# Patient Record
Sex: Male | Born: 1985 | Race: White | Hispanic: No | Marital: Married | Smoking: Never smoker
Health system: Southern US, Community
[De-identification: ages and names within clinical notes are randomized; demographics above are authoritative.]

---

## 2015-05-25 ENCOUNTER — Emergency Department (HOSPITAL_COMMUNITY): Payer: 59

## 2015-05-25 ENCOUNTER — Observation Stay (HOSPITAL_COMMUNITY): Payer: 59 | Admitting: Certified Registered Nurse Anesthetist

## 2015-05-25 ENCOUNTER — Encounter (HOSPITAL_COMMUNITY): Payer: Self-pay | Admitting: *Deleted

## 2015-05-25 ENCOUNTER — Encounter (HOSPITAL_COMMUNITY)
Admission: EM | Disposition: A | Discharge status: Home / Self Care | Payer: Self-pay | Source: Home / Self Care | Attending: Emergency Medicine

## 2015-05-25 ENCOUNTER — Observation Stay (HOSPITAL_COMMUNITY)
Admission: EM | Admit: 2015-05-25 | Discharge: 2015-05-26 | Disposition: A | Discharge status: Home / Self Care | Payer: 59 | Attending: General Surgery | Admitting: General Surgery

## 2015-05-25 DIAGNOSIS — K3580 Unspecified acute appendicitis: Secondary | ICD-10-CM | POA: Diagnosis present

## 2015-05-25 DIAGNOSIS — K353 Acute appendicitis with localized peritonitis, without perforation or gangrene: Secondary | ICD-10-CM

## 2015-05-25 HISTORY — PX: LAPAROSCOPIC APPENDECTOMY: SHX408

## 2015-05-25 LAB — URINALYSIS, ROUTINE W REFLEX MICROSCOPIC
BILIRUBIN URINE: NEGATIVE
Glucose, UA: NEGATIVE mg/dL
HGB URINE DIPSTICK: NEGATIVE
KETONES UR: NEGATIVE mg/dL
Leukocytes, UA: NEGATIVE
Nitrite: NEGATIVE
Protein, ur: NEGATIVE mg/dL
SPECIFIC GRAVITY, URINE: 1.017 (ref 1.005–1.030)
UROBILINOGEN UA: 0.2 mg/dL (ref 0.0–1.0)
pH: 6.5 (ref 5.0–8.0)

## 2015-05-25 LAB — SURGICAL PCR SCREEN
MRSA, PCR: NEGATIVE
Staphylococcus aureus: NEGATIVE

## 2015-05-25 LAB — CBC
HEMATOCRIT: 45.7 % (ref 39.0–52.0)
Hemoglobin: 15.4 g/dL (ref 13.0–17.0)
MCH: 31.3 pg (ref 26.0–34.0)
MCHC: 33.7 g/dL (ref 30.0–36.0)
MCV: 92.9 fL (ref 78.0–100.0)
PLATELETS: 248 10*3/uL (ref 150–400)
RBC: 4.92 MIL/uL (ref 4.22–5.81)
RDW: 12.1 % (ref 11.5–15.5)
WBC: 7.8 10*3/uL (ref 4.0–10.5)

## 2015-05-25 LAB — COMPREHENSIVE METABOLIC PANEL
ALBUMIN: 4.9 g/dL (ref 3.5–5.0)
ALK PHOS: 87 U/L (ref 38–126)
ALT: 57 U/L (ref 17–63)
AST: 41 U/L (ref 15–41)
Anion gap: 6 (ref 5–15)
BILIRUBIN TOTAL: 0.8 mg/dL (ref 0.3–1.2)
BUN: 10 mg/dL (ref 6–20)
CALCIUM: 9.6 mg/dL (ref 8.9–10.3)
CO2: 29 mmol/L (ref 22–32)
CREATININE: 0.94 mg/dL (ref 0.61–1.24)
Chloride: 103 mmol/L (ref 101–111)
GFR calc Af Amer: >60 mL/min (ref >60)
GLUCOSE: 96 mg/dL (ref 65–99)
POTASSIUM: 4 mmol/L (ref 3.5–5.1)
Sodium: 138 mmol/L (ref 135–145)
TOTAL PROTEIN: 7.8 g/dL (ref 6.5–8.1)

## 2015-05-25 LAB — LIPASE, BLOOD: Lipase: 73 U/L — ABNORMAL HIGH (ref 22–51)

## 2015-05-25 SURGERY — APPENDECTOMY, LAPAROSCOPIC
Anesthesia: General | Site: Abdomen

## 2015-05-25 MED ORDER — OXYCODONE HCL 5 MG PO TABS
5.0000 mg | ORAL_TABLET | ORAL | Status: DC | PRN
  Administered 2015-05-26: 10 mg via ORAL
  Filled 2015-05-25 (×2): qty 2

## 2015-05-25 MED ORDER — DEXAMETHASONE SODIUM PHOSPHATE 10 MG/ML IJ SOLN
INTRAMUSCULAR | Status: DC | PRN
  Administered 2015-05-25: 10 mg via INTRAVENOUS

## 2015-05-25 MED ORDER — HYDROMORPHONE HCL 2 MG/ML IJ SOLN
INTRAMUSCULAR | Status: AC
  Filled 2015-05-25: qty 1

## 2015-05-25 MED ORDER — NEOSTIGMINE METHYLSULFATE 10 MG/10ML IV SOLN
INTRAVENOUS | Status: AC
  Filled 2015-05-25: qty 1

## 2015-05-25 MED ORDER — ROCURONIUM BROMIDE 100 MG/10ML IV SOLN
INTRAVENOUS | Status: AC
  Filled 2015-05-25: qty 1

## 2015-05-25 MED ORDER — OXYCODONE HCL 5 MG PO TABS: 5.0000 mg | ORAL_TABLET | Freq: Once | ORAL | Status: DC | PRN

## 2015-05-25 MED ORDER — LIDOCAINE HCL (CARDIAC) 20 MG/ML IV SOLN
INTRAVENOUS | Status: AC
  Filled 2015-05-25: qty 5

## 2015-05-25 MED ORDER — GLYCOPYRROLATE 0.2 MG/ML IJ SOLN
INTRAMUSCULAR | Status: DC | PRN
  Administered 2015-05-25: 0.6 mg via INTRAVENOUS

## 2015-05-25 MED ORDER — FENTANYL CITRATE (PF) 100 MCG/2ML IJ SOLN
INTRAMUSCULAR | Status: DC | PRN
  Administered 2015-05-25: 50 ug via INTRAVENOUS
  Administered 2015-05-25: 150 ug via INTRAVENOUS
  Administered 2015-05-25: 50 ug via INTRAVENOUS

## 2015-05-25 MED ORDER — SODIUM CHLORIDE 0.9 % IV BOLUS (SEPSIS)
500.0000 mL | Freq: Once | INTRAVENOUS | Status: AC
  Administered 2015-05-25: 500 mL via INTRAVENOUS

## 2015-05-25 MED ORDER — NEOSTIGMINE METHYLSULFATE 10 MG/10ML IV SOLN
INTRAVENOUS | Status: DC | PRN
  Administered 2015-05-25: 4 mg via INTRAVENOUS

## 2015-05-25 MED ORDER — HYDROMORPHONE HCL 1 MG/ML IJ SOLN
1.0000 mg | Freq: Once | INTRAMUSCULAR | Status: AC
  Administered 2015-05-25: 1 mg via INTRAVENOUS
  Filled 2015-05-25: qty 1

## 2015-05-25 MED ORDER — DEXTROSE-NACL 5-0.9 % IV SOLN
INTRAVENOUS | Status: DC
  Administered 2015-05-25: 1000 mL via INTRAVENOUS

## 2015-05-25 MED ORDER — ONDANSETRON HCL 4 MG/2ML IJ SOLN: 4.0000 mg | Freq: Four times a day (QID) | INTRAMUSCULAR | Status: DC | PRN

## 2015-05-25 MED ORDER — ONDANSETRON HCL 4 MG/2ML IJ SOLN
4.0000 mg | Freq: Once | INTRAMUSCULAR | Status: AC
  Administered 2015-05-25: 4 mg via INTRAVENOUS
  Filled 2015-05-25: qty 2

## 2015-05-25 MED ORDER — MIDAZOLAM HCL 5 MG/5ML IJ SOLN
INTRAMUSCULAR | Status: DC | PRN
  Administered 2015-05-25: 2 mg via INTRAVENOUS

## 2015-05-25 MED ORDER — PROPOFOL 10 MG/ML IV BOLUS
INTRAVENOUS | Status: DC | PRN
  Administered 2015-05-25: 150 mg via INTRAVENOUS
  Administered 2015-05-25: 50 mg via INTRAVENOUS

## 2015-05-25 MED ORDER — DEXTROSE 5 % IV SOLN
2.0000 g | Freq: Once | INTRAVENOUS | Status: AC
  Administered 2015-05-25: 2 g via INTRAVENOUS

## 2015-05-25 MED ORDER — HYDROMORPHONE HCL 1 MG/ML IJ SOLN: 0.2500 mg | INTRAMUSCULAR | Status: DC | PRN

## 2015-05-25 MED ORDER — LACTATED RINGERS IV SOLN
INTRAVENOUS | Status: DC | PRN
  Administered 2015-05-25 (×2): via INTRAVENOUS

## 2015-05-25 MED ORDER — DEXTROSE 5 % IV SOLN
INTRAVENOUS | Status: AC
  Filled 2015-05-25: qty 2

## 2015-05-25 MED ORDER — LIDOCAINE HCL (CARDIAC) 20 MG/ML IV SOLN
INTRAVENOUS | Status: DC | PRN
  Administered 2015-05-25: 100 mg via INTRAVENOUS

## 2015-05-25 MED ORDER — LACTATED RINGERS IV SOLN
INTRAVENOUS | Status: DC | PRN
  Administered 2015-05-25: 1000 mL via INTRAVENOUS

## 2015-05-25 MED ORDER — PROPOFOL 10 MG/ML IV BOLUS
INTRAVENOUS | Status: AC
  Filled 2015-05-25: qty 20

## 2015-05-25 MED ORDER — IOHEXOL 300 MG/ML  SOLN
50.0000 mL | Freq: Once | INTRAMUSCULAR | Status: AC | PRN
  Administered 2015-05-25: 50 mL via ORAL

## 2015-05-25 MED ORDER — MIDAZOLAM HCL 2 MG/2ML IJ SOLN
INTRAMUSCULAR | Status: AC
  Filled 2015-05-25: qty 4

## 2015-05-25 MED ORDER — SUCCINYLCHOLINE CHLORIDE 20 MG/ML IJ SOLN
INTRAMUSCULAR | Status: DC | PRN
  Administered 2015-05-25: 100 mg via INTRAVENOUS

## 2015-05-25 MED ORDER — ONDANSETRON HCL 4 MG/2ML IJ SOLN
INTRAMUSCULAR | Status: DC | PRN
  Administered 2015-05-25: 4 mg via INTRAVENOUS

## 2015-05-25 MED ORDER — GLYCOPYRROLATE 0.2 MG/ML IJ SOLN
INTRAMUSCULAR | Status: AC
  Filled 2015-05-25: qty 3

## 2015-05-25 MED ORDER — BUPIVACAINE-EPINEPHRINE (PF) 0.25% -1:200000 IJ SOLN
INTRAMUSCULAR | Status: AC
  Filled 2015-05-25: qty 30

## 2015-05-25 MED ORDER — HYDROMORPHONE HCL 1 MG/ML IJ SOLN
INTRAMUSCULAR | Status: DC | PRN
  Administered 2015-05-25 (×4): 0.5 mg via INTRAVENOUS

## 2015-05-25 MED ORDER — IOHEXOL 300 MG/ML  SOLN
100.0000 mL | Freq: Once | INTRAMUSCULAR | Status: AC | PRN
  Administered 2015-05-25: 100 mL via INTRAVENOUS

## 2015-05-25 MED ORDER — FENTANYL CITRATE (PF) 250 MCG/5ML IJ SOLN
INTRAMUSCULAR | Status: AC
  Filled 2015-05-25: qty 25

## 2015-05-25 MED ORDER — ONDANSETRON HCL 4 MG/2ML IJ SOLN
INTRAMUSCULAR | Status: AC
  Filled 2015-05-25: qty 2

## 2015-05-25 MED ORDER — 0.9 % SODIUM CHLORIDE (POUR BTL) OPTIME
TOPICAL | Status: DC | PRN
  Administered 2015-05-25: 1000 mL

## 2015-05-25 MED ORDER — ONDANSETRON 4 MG PO TBDP: 4.0000 mg | ORAL_TABLET | Freq: Four times a day (QID) | ORAL | Status: DC | PRN

## 2015-05-25 MED ORDER — OXYCODONE HCL 5 MG/5ML PO SOLN
5.0000 mg | Freq: Once | ORAL | Status: DC | PRN
  Filled 2015-05-25: qty 5

## 2015-05-25 MED ORDER — BUPIVACAINE-EPINEPHRINE 0.25% -1:200000 IJ SOLN
INTRAMUSCULAR | Status: DC | PRN
  Administered 2015-05-25: 11 mL

## 2015-05-25 MED ORDER — SODIUM CHLORIDE 0.9 % IJ SOLN
INTRAMUSCULAR | Status: AC
  Filled 2015-05-25: qty 10

## 2015-05-25 MED ORDER — HYDROMORPHONE HCL 1 MG/ML IJ SOLN
1.0000 mg | INTRAMUSCULAR | Status: DC | PRN
  Administered 2015-05-26: 1 mg via INTRAVENOUS
  Filled 2015-05-25: qty 1

## 2015-05-25 MED ORDER — ROCURONIUM BROMIDE 100 MG/10ML IV SOLN
INTRAVENOUS | Status: DC | PRN
  Administered 2015-05-25: 25 mg via INTRAVENOUS

## 2015-05-25 SURGICAL SUPPLY — 37 items
APPLIER CLIP 5 13 M/L LIGAMAX5 (MISCELLANEOUS)
APPLIER CLIP ROT 10 11.4 M/L (STAPLE)
BENZOIN TINCTURE PRP APPL 2/3 (GAUZE/BANDAGES/DRESSINGS) ×2 IMPLANT
CHLORAPREP W/TINT 26ML (MISCELLANEOUS) ×2 IMPLANT
CLIP APPLIE 5 13 M/L LIGAMAX5 (MISCELLANEOUS) IMPLANT
CLIP APPLIE ROT 10 11.4 M/L (STAPLE) IMPLANT
COVER SURGICAL LIGHT HANDLE (MISCELLANEOUS) ×4 IMPLANT
COVER TRANSDUCER ULTRASND (DRAPES) ×2 IMPLANT
DECANTER SPIKE VIAL GLASS SM (MISCELLANEOUS) ×2 IMPLANT
DEVICE TROCAR PUNCTURE CLOSURE (ENDOMECHANICALS) ×2 IMPLANT
DRAPE LAPAROSCOPIC ABDOMINAL (DRAPES) ×2 IMPLANT
DRSG TEGADERM 2-3/8X2-3/4 SM (GAUZE/BANDAGES/DRESSINGS) ×6 IMPLANT
ELECT REM PT RETURN 9FT ADLT (ELECTROSURGICAL) ×2
ELECTRODE REM PT RTRN 9FT ADLT (ELECTROSURGICAL) ×1 IMPLANT
ENDOLOOP SUT PDS II  0 18 (SUTURE) ×3
ENDOLOOP SUT PDS II 0 18 (SUTURE) ×3 IMPLANT
GAUZE SPONGE 2X2 8PLY STRL LF (GAUZE/BANDAGES/DRESSINGS) ×1 IMPLANT
GLOVE BIO SURGEON STRL SZ7.5 (GLOVE) ×2 IMPLANT
GOWN STRL REUS W/TWL XL LVL3 (GOWN DISPOSABLE) ×4 IMPLANT
KIT BASIN OR (CUSTOM PROCEDURE TRAY) ×2 IMPLANT
NEEDLE INSUFFLATION 14GA 120MM (NEEDLE) ×2 IMPLANT
SCISSORS LAP 5X35 DISP (ENDOMECHANICALS) ×2 IMPLANT
SET IRRIG TUBING LAPAROSCOPIC (IRRIGATION / IRRIGATOR) ×2 IMPLANT
SLEEVE SURGEON STRL (DRAPES) ×2 IMPLANT
SLEEVE XCEL OPT CAN 5 100 (ENDOMECHANICALS) ×2 IMPLANT
SOLUTION ANTI FOG 6CC (MISCELLANEOUS) ×2 IMPLANT
SPONGE GAUZE 2X2 STER 10/PKG (GAUZE/BANDAGES/DRESSINGS) ×1
STRIP CLOSURE SKIN 1/2X4 (GAUZE/BANDAGES/DRESSINGS) ×2 IMPLANT
SUT MNCRL AB 4-0 PS2 18 (SUTURE) ×2 IMPLANT
TOWEL OR 17X26 10 PK STRL BLUE (TOWEL DISPOSABLE) ×2 IMPLANT
TOWEL OR NON WOVEN STRL DISP B (DISPOSABLE) ×2 IMPLANT
TRAY FOLEY W/METER SILVER 14FR (SET/KITS/TRAYS/PACK) IMPLANT
TRAY FOLEY W/METER SILVER 16FR (SET/KITS/TRAYS/PACK) ×2 IMPLANT
TRAY LAPAROSCOPIC (CUSTOM PROCEDURE TRAY) ×2 IMPLANT
TROCAR BLADELESS OPT 5 100 (ENDOMECHANICALS) ×2 IMPLANT
TROCAR XCEL NON-BLD 11X100MML (ENDOMECHANICALS) ×2 IMPLANT
TUBING INSUFFLATION 10FT LAP (TUBING) ×2 IMPLANT

## 2015-05-25 NOTE — Anesthesia Preprocedure Evaluation (Signed)
Anesthesia Evaluation  Patient identified by MRN, date of birth, ID band Patient awake    Reviewed: Allergy & Precautions, NPO status , Patient's Chart, lab work & pertinent test results  Airway Mallampati: I   Neck ROM: full    Dental   Pulmonary neg pulmonary ROS,  breath sounds clear to auscultation        Cardiovascular negative cardio ROS  Rhythm:regular Rate:Normal     Neuro/Psych    GI/Hepatic   Endo/Other    Renal/GU      Musculoskeletal   Abdominal   Peds  Hematology   Anesthesia Other Findings   Reproductive/Obstetrics                             Anesthesia Physical Anesthesia Plan  ASA: I and emergent  Anesthesia Plan: General   Post-op Pain Management:    Induction: Intravenous, Cricoid pressure planned and Rapid sequence  Airway Management Planned: Oral ETT  Additional Equipment:   Intra-op Plan:   Post-operative Plan: Extubation in OR  Informed Consent: I have reviewed the patients History and Physical, chart, labs and discussed the procedure including the risks, benefits and alternatives for the proposed anesthesia with the patient or authorized representative who has indicated his/her understanding and acceptance.     Plan Discussed with: CRNA, Anesthesiologist and Surgeon  Anesthesia Plan Comments:         Anesthesia Quick Evaluation

## 2015-05-25 NOTE — Op Note (Signed)
05/25/2015  9:13 PM  PATIENT:  Gary Lambert  29 y.o. male  PRE-OPERATIVE DIAGNOSIS:  appendicitis  POST-OPERATIVE DIAGNOSIS:  appendicitis  PROCEDURE:  Procedure(s): APPENDECTOMY LAPAROSCOPIC (N/A)  SURGEON:  Surgeon(s) and Role:    * Axel Filler, MD - Primary  ANESTHESIA:   local and general  EBL:<5cc   Total I/O In: -  Out: 200 [Urine:200]  BLOOD ADMINISTERED:none  DRAINS: none   LOCAL MEDICATIONS USED:  BUPIVICAINE   SPECIMEN:  Source of Specimen:  appendix  DISPOSITION OF SPECIMEN:  PATHOLOGY  COUNTS:  YES  TOURNIQUET:  * No tourniquets in log *  DICTATION: .Dragon Dictation  Findings:  The patient had a acutely inflamed nonperforated appendix  Indications for procedure:  The patient is a 29 year old male with a history of periumbilical pain localized in the right lower quadrant patient had a CT scan which revealed signs consistent with acute appendicitis the patient back in for laparoscopic appendectomy.  Details of the procedure:The patient was taken back to the operating room.  A foley catheter was placed.  The patient was placed in supine position with bilateral SCDs in place. The patient was prepped and draped in the usual sterile fashion.  After appropriate anitbiotics were confirmed, a time-out was confirmed and all facts were verified.  A pneumoperitoneum of 14 mmHg was obtained via a Veress needle technique in the left lower quadrant quadrant.  A 5 mm trocar and 5 mm camera then placed intra-abdominally there is no injury to any intra-abdominal organs a 10 mm infraumbilical port was placed and direct visualization as was a 5 mm port in the suprapubic area. The appendix was identified  The appendix identified and cleaned down to the appendiceal base. The appendiceal artery was taken with Bovie cautery maintaining hemostasis, the mesoappendix was then incised.  The the appendiceal base was clean.  At this time an Endoloop was placed proximallyx2 and one  distally and the appendix was transected between these 2.  A retrieval  bag was then placed into the abdomen and the specimen placed in the bag. The appendiceal stump was cauterized. We evacuate the fluid from the pelvis until the effluent was clear. The omentum was brought over the appendiceal stump. The appendix a latex retrieval  bag was then retrieved via the supraumbilical port. #1 Vicryl was used to reapproximate the fascia at the umbilical port site x2. The skin was reapproximated all port sites 3-0 Monocryl subcuticular fashion. The skin was dressed with Steri-Strips gauze and tape. The patient was awakened from general anesthesia was taken to recovery room in stable condition.       PLAN OF CARE: Admit for overnight observation  PATIENT DISPOSITION:  PACU - hemodynamically stable.   Delay start of Pharmacological VTE agent (>24hrs) due to surgical blood loss or risk of bleeding: yes

## 2015-05-25 NOTE — Progress Notes (Signed)
CM spoke with pt who confirms uninsured [employer] resident with no pcp.  CM discussed and provided written information for uninsured accepting pcps, discussed the importance of pcp vs EDP services for f/u care, www.needymeds.org, www.goodrx.com, discounted pharmacies and other [employer] such as [facility] , [employer], affordable care act, financial assistance, uninsured dental services,  med assist, DSS and  health department  Reviewed resources for [employer] uninsured accepting pcps like [facility], family medicine at [facility], [facility], [facility], local urgent care centers, [facility], [facility] family practice, general medical clinics, family services of the [place], [facility] urgent care plus others, medication resources, [facility] out patient [facility]ies and housing Pt voiced understanding and appreciation of resources provided   Provided [facility] contact information Pt states he recently got married and is getting insurance soon via wife coverage Pt refuses referral at this time

## 2015-05-25 NOTE — ED Notes (Signed)
Pt reports RLQ pain since last night. Pain 10/10. Denies n/v. Denies dysuria. Denies blood in stool or urine.

## 2015-05-25 NOTE — H&P (Signed)
Gary Lambert is an 29 y.o. male.   Chief Complaint: abdominal pain  HPI: 29 y/o M with Gen abd pain that started approx 24h ago.  Pt states that it localized to the RLQ.  Pt states that the pain continued throughout the day.  He presented to the ED for further care.  CT and labs were ordered in ED and showed signs of acute appendicitis on CT.  History reviewed. No pertinent past medical history.  History reviewed. No pertinent past surgical history.  History reviewed. No pertinent family history. Social History:  reports that he has never smoked. He does not have any smokeless tobacco history on file. He reports that he drinks alcohol. His drug history is not on file.  Allergies: No Known Allergies   (Not in a hospital admission)  Results for orders placed or performed during the hospital encounter of 05/25/15 (from the past 48 hour(s))  Urinalysis, Routine w reflex microscopic (not at [facility])     Status: None   Collection Time: 05/25/15  1:18 PM  Result Value Ref Range   Color, Urine YELLOW YELLOW   APPearance CLEAR CLEAR   Specific Gravity, Urine 1.017 1.005 - 1.030   pH 6.5 5.0 - 8.0   Glucose, UA NEGATIVE NEGATIVE mg/dL   Hgb urine dipstick NEGATIVE NEGATIVE   Bilirubin Urine NEGATIVE NEGATIVE   Ketones, ur NEGATIVE NEGATIVE mg/dL   Protein, ur NEGATIVE NEGATIVE mg/dL   Urobilinogen, UA 0.2 0.0 - 1.0 mg/dL   Nitrite NEGATIVE NEGATIVE   Leukocytes, UA NEGATIVE NEGATIVE    Comment: MICROSCOPIC NOT DONE ON URINES WITH NEGATIVE PROTEIN, BLOOD, LEUKOCYTES, NITRITE, OR GLUCOSE <1000 mg/dL.  Lipase, blood     Status: Abnormal   Collection Time: 05/25/15  2:06 PM  Result Value Ref Range   Lipase 73 (H) 22 - 51 U/L  Comprehensive metabolic panel     Status: None   Collection Time: 05/25/15  2:06 PM  Result Value Ref Range   Sodium 138 135 - 145 mmol/L   Potassium 4.0 3.5 - 5.1 mmol/L   Chloride 103 101 - 111 mmol/L   CO2 29 22 - 32 mmol/L   Glucose, Bld 96 65 - 99 mg/dL   BUN 10 6 - 20 mg/dL   Creatinine, Ser 0.94 0.61 - 1.24 mg/dL   Calcium 9.6 8.9 - 10.3 mg/dL   Total Protein 7.8 6.5 - 8.1 g/dL   Albumin 4.9 3.5 - 5.0 g/dL   AST 41 15 - 41 U/L   ALT 57 17 - 63 U/L   Alkaline Phosphatase 87 38 - 126 U/L   Total Bilirubin 0.8 0.3 - 1.2 mg/dL   GFR calc non Af Amer >60 >60 mL/min   GFR calc Af Amer >60 >60 mL/min    Comment: (NOTE) The eGFR has been calculated using the CKD EPI equation. This calculation has not been validated in all clinical situations. eGFR's persistently <60 mL/min signify possible Chronic Kidney Disease.    Anion gap 6 5 - 15  CBC     Status: None   Collection Time: 05/25/15  2:06 PM  Result Value Ref Range   WBC 7.8 4.0 - 10.5 K/uL   RBC 4.92 4.22 - 5.81 MIL/uL   Hemoglobin 15.4 13.0 - 17.0 g/dL   HCT 45.7 39.0 - 52.0 %   MCV 92.9 78.0 - 100.0 fL   MCH 31.3 26.0 - 34.0 pg   MCHC 33.7 30.0 - 36.0 g/dL   RDW 12.1 11.5 -  15.5 %   Platelets 248 150 - 400 K/uL   Ct Abdomen Pelvis W Contrast  05/25/2015   CLINICAL DATA:  Right lower quadrant pain since last night.  EXAM: CT ABDOMEN AND PELVIS WITH CONTRAST  TECHNIQUE: Multidetector CT imaging of the abdomen and pelvis was performed using the standard protocol following bolus administration of intravenous contrast.  CONTRAST:  130m OMNIPAQUE IOHEXOL 300 MG/ML  SOLN  COMPARISON:  None.  FINDINGS: Lower chest: A few scattered pulmonary nodules measure 4 mm or less in size and are most likely benign in a patient of this age. Heart size normal. No pericardial or pleural effusion.  Hepatobiliary: Liver and gallbladder are unremarkable. No biliary ductal dilatation.  Pancreas: Negative.  Spleen: Negative.  Adrenals/Urinary Tract: Adrenal glands and kidneys are unremarkable. Likely early excretion of contrast from the left kidney. Difficult to definitively exclude a stone (series 2, image 33). Ureters are decompressed. Bladder is unremarkable.  Stomach/Bowel: Stomach and small bowel are  unremarkable. Appendix is dilated and fluid filled, with a diameter of 11 mm. There is surrounding inflammatory haziness and fluid. Colon is unremarkable.  Vascular/Lymphatic: Vascular structures are unremarkable. No pathologically enlarged lymph nodes.  Reproductive: Prostate is normal in size.  Other: Small pelvic free fluid. Mesenteries and peritoneum are otherwise unremarkable.  Musculoskeletal: No worrisome lytic or sclerotic lesions.  IMPRESSION: 1. Acute appendicitis with mild periappendiceal inflammatory changes. No perforation or abscess. 2. Small pelvic free fluid. 3. Likely early excretion of contrast from the left kidney. Difficult to definitively exclude a nonobstructing stone.   Electronically Signed   By: MLorin PicketM.D.   On: 05/25/2015 16:43    Review of Systems  Constitutional: Negative.   HENT: Negative.   Respiratory: Negative.   Cardiovascular: Negative.   Gastrointestinal: Positive for nausea and abdominal pain.  Musculoskeletal: Negative.   Neurological: Negative.     Blood pressure 123/67, pulse 71, temperature 98.9 F (37.2 C), temperature source Oral, resp. rate 16, SpO2 100 %. Physical Exam  Constitutional: He is oriented to person, place, and time. He appears well-developed and well-nourished.  HENT:  Head: Normocephalic and atraumatic.  Eyes: Conjunctivae and EOM are normal. Pupils are equal, round, and reactive to light.  Neck: Normal range of motion. Neck supple.  Cardiovascular: Normal rate, regular rhythm and normal heart sounds.   Respiratory: Effort normal and breath sounds normal.  GI: Soft. Bowel sounds are normal. He exhibits no distension and no mass. There is tenderness (RLQ). There is no rebound and no guarding.  Musculoskeletal: Normal range of motion.  Neurological: He is alert and oriented to person, place, and time.  Skin: Skin is warm and dry.     Assessment/Plan 29y/o M with acute appendicitis 1. NPO 2. Consent for Lap appy.  I d/w  the pt the risks and benefits of the procedure to include but not limited to: infection, bleeding, damage to surrounding structures, possible post op abscess, and possible need for further abscess drainage. 3. Admit to hospital  RRosario Jacks, AWest [facility]aces [facility] Center8/22/2016, 6:27 PM

## 2015-05-25 NOTE — ED Provider Notes (Signed)
CSN: [id number]     Arrival date & time 05/25/15  1302 History   First MD Initiated Contact with Patient 05/25/15 1516     Chief Complaint  Patient presents with  . Abdominal Pain     (Consider location/radiation/quality/duration/timing/severity/associated sxs/prior Treatment) Patient is a 29 y.o. male presenting with abdominal pain. The history is provided by the patient (pt states he started with abd pain last night,  now has worse pain in right lower quadrant).  Abdominal Pain Pain location:  RLQ Pain quality: aching   Pain radiates to:  Does not radiate Pain severity:  Moderate Onset quality:  Gradual Timing:  Constant Progression:  Waxing and waning Chronicity:  New Associated symptoms: no chest pain, no cough, no diarrhea, no fatigue and no hematuria     History reviewed. No pertinent past medical history. History reviewed. No pertinent past surgical history. History reviewed. No pertinent family history. Social History  Substance Use Topics  . Smoking status: Never Smoker   . Smokeless tobacco: None  . Alcohol Use: Yes    Review of Systems  Constitutional: Negative for appetite change and fatigue.  HENT: Negative for congestion, ear discharge and sinus pressure.   Eyes: Negative for discharge.  Respiratory: Negative for cough.   Cardiovascular: Negative for chest pain.  Gastrointestinal: Positive for abdominal pain. Negative for diarrhea.  Genitourinary: Negative for frequency and hematuria.  Musculoskeletal: Negative for back pain.  Skin: Negative for rash.  Neurological: Negative for seizures and headaches.  Psychiatric/Behavioral: Negative for hallucinations.      Allergies  Review of patient's allergies indicates no known allergies.  Home Medications   Prior to Admission medications   Not on File   BP 133/79 mmHg  Pulse 68  Temp(Src) 97.9 F (36.6 C) (Oral)  Resp 16  SpO2 100% Physical Exam  Constitutional: He is oriented to person, place,  and time. He appears well-developed.  HENT:  Head: Normocephalic.  Eyes: Conjunctivae and EOM are normal. No scleral icterus.  Neck: Neck supple. No thyromegaly present.  Cardiovascular: Normal rate and regular rhythm.  Exam reveals no gallop and no friction rub.   No murmur heard. Pulmonary/Chest: No stridor. He has no wheezes. He has no rales. He exhibits no tenderness.  Abdominal: He exhibits no distension. There is tenderness. There is no rebound.  Tender rlq,  Some rebound  Musculoskeletal: Normal range of motion. He exhibits no edema.  Lymphadenopathy:    He has no cervical adenopathy.  Neurological: He is oriented to person, place, and time. He exhibits normal muscle tone. Coordination normal.  Skin: No rash noted. No erythema.  Psychiatric: He has a normal mood and affect. His behavior is normal.    ED Course  Procedures (including critical care time) Labs Review Labs Reviewed  LIPASE, BLOOD - Abnormal; Notable for the following:    Lipase 73 (*)    All other components within normal limits  COMPREHENSIVE METABOLIC PANEL  CBC  URINALYSIS, ROUTINE W REFLEX MICROSCOPIC (NOT AT [facility])    Imaging Review Ct Abdomen Pelvis W Contrast  05/25/2015   CLINICAL DATA:  Right lower quadrant pain since last night.  EXAM: CT ABDOMEN AND PELVIS WITH CONTRAST  TECHNIQUE: Multidetector CT imaging of the abdomen and pelvis was performed using the standard protocol following bolus administration of intravenous contrast.  CONTRAST:  OMNIPAQUE IOHEXOL 300 MG/ML  SOLN  COMPARISON:  None.  FINDINGS: Lower chest: A few scattered pulmonary nodules measure 4 mm or less in size and  are most likely benign in a patient of this age. Heart size normal. No pericardial or pleural effusion.  Hepatobiliary: Liver and gallbladder are unremarkable. No biliary ductal dilatation.  Pancreas: Negative.  Spleen: Negative.  Adrenals/Urinary Tract: Adrenal glands and kidneys are unremarkable. Likely early excretion  of contrast from the left kidney. Difficult to definitively exclude a stone (series 2, image 33). Ureters are decompressed. Bladder is unremarkable.  Stomach/Bowel: Stomach and small bowel are unremarkable. Appendix is dilated and fluid filled, with a diameter of 11 mm. There is surrounding inflammatory haziness and fluid. Colon is unremarkable.  Vascular/Lymphatic: Vascular structures are unremarkable. No pathologically enlarged lymph nodes.  Reproductive: Prostate is normal in size.  Other: Small pelvic free fluid. Mesenteries and peritoneum are otherwise unremarkable.  Musculoskeletal: No worrisome lytic or sclerotic lesions.  IMPRESSION: 1. Acute appendicitis with mild periappendiceal inflammatory changes. No perforation or abscess. 2. Small pelvic free fluid. 3. Likely early excretion of contrast from the left kidney. Difficult to definitively exclude a nonobstructing stone.   Electronically Signed   By: Leanna Battles M.D.   On: 05/25/2015 16:43   I have personally reviewed and evaluated these images and lab results as part of my medical decision-making.   EKG Interpretation None      MDM   Final diagnoses:  Acute appendicitis with localized peritonitis    Appendicitis,  Surgery to consult     Bethann Berkshire, MD 05/25/15 1744

## 2015-05-25 NOTE — Transfer of Care (Signed)
Immediate Anesthesia Transfer of Care Note  Patient: Gary Lambert  Procedure(s) Performed: Procedure(s): APPENDECTOMY LAPAROSCOPIC (N/A)  Patient Location: PACU  Anesthesia Type:General  Level of Consciousness: awake, alert  and oriented  Airway & Oxygen Therapy: Patient Spontanous Breathing and Patient connected to nasal cannula oxygen  Post-op Assessment: Report given to RN and Post -op Vital signs reviewed and stable  Post vital signs: Reviewed and stable  Last Vitals:  Filed Vitals:   05/25/15 1936  BP: 112/63  Pulse: 73  Temp: 37.1 C  Resp: 16    Complications: No apparent anesthesia complications

## 2015-05-25 NOTE — Anesthesia Procedure Notes (Signed)
Procedure Name: Intubation Date/Time: 05/25/2015 8:23 PM Performed by: Leroy Libman L Patient Re-evaluated:Patient Re-evaluated prior to inductionOxygen Delivery Method: Circle system utilized Preoxygenation: Pre-oxygenation with 100% oxygen Intubation Type: IV induction, Rapid sequence and Cricoid Pressure applied Laryngoscope Size: Miller and 3 Grade View: Grade I Tube type: Oral Number of attempts: 1 Airway Equipment and Method: Stylet Placement Confirmation: ETT inserted through vocal cords under direct vision,  breath sounds checked- equal and bilateral and positive ETCO2 Secured at: 21 cm Tube secured with: Tape Dental Injury: Teeth and Oropharynx as per pre-operative assessment

## 2015-05-26 ENCOUNTER — Encounter (HOSPITAL_COMMUNITY): Payer: Self-pay | Admitting: General Surgery

## 2015-05-26 MED ORDER — ACETAMINOPHEN 325 MG PO TABS: 650.0000 mg | ORAL_TABLET | Freq: Four times a day (QID) | ORAL | Status: DC | PRN

## 2015-05-26 MED ORDER — OXYCODONE HCL 5 MG PO TABS: 5.0000 mg | ORAL_TABLET | ORAL | Status: DC | PRN

## 2015-05-26 MED ORDER — IBUPROFEN 200 MG PO TABS: ORAL_TABLET | ORAL | Status: AC

## 2015-05-26 MED ORDER — IBUPROFEN 200 MG PO TABS
600.0000 mg | ORAL_TABLET | Freq: Four times a day (QID) | ORAL | Status: DC | PRN
  Administered 2015-05-26: 600 mg via ORAL
  Filled 2015-05-26: qty 3

## 2015-05-26 NOTE — Progress Notes (Signed)
1 Day Post-Op  Subjective: Doing well, I will plan to send home after he eats and walks  Objective: Vital signs in last 24 hours: Temp:  [97.9 F (36.6 C)-98.9 F (37.2 C)] 97.9 F (36.6 C) (08/23 0530) Pulse Rate:  [68-95] 75 (08/23 0530) Resp:  [16-20] 18 (08/23 0530) BP: (112-136)/(56-84) 117/67 mmHg (08/23 0530) SpO2:  [98 %-100 %] 100 % (08/23 0530) Weight:  [74.1 kg (163 lb 5.8 oz)] 74.1 kg (163 lb 5.8 oz) (08/22 1936)   Afebrile, VSS Regular diet Intake/Output from previous day: 08/22 0701 - 08/23 0700 In: 2635 [P.O.:720; I.V.:1915] Out: 1175 [Urine:1125; Blood:50] Intake/Output this shift: Total I/O In: 200 [P.O.:200] Out: 800 [Urine:800]  General appearance: alert, cooperative and no distress Resp: clear to auscultation bilaterally GI: soft sore, sites look fine  Lab Results:   Recent Labs  05/25/15 1406  WBC 7.8  HGB 15.4  HCT 45.7  PLT 248    BMET  Recent Labs  05/25/15 1406  NA 138  K 4.0  CL 103  CO2 29  GLUCOSE 96  BUN 10  CREATININE 0.94  CALCIUM 9.6   PT/INR No results for input(s): LABPROT, INR in the last 72 hours.   Recent Labs Lab 05/25/15 1406  AST 41  ALT 57  ALKPHOS 87  BILITOT 0.8  PROT 7.8  ALBUMIN 4.9     Lipase     Component Value Date/Time   LIPASE 73* 05/25/2015 1406     Studies/Results: Ct Abdomen Pelvis W Contrast  05/25/2015   CLINICAL DATA:  Right lower quadrant pain since last night.  EXAM: CT ABDOMEN AND PELVIS WITH CONTRAST  TECHNIQUE: Multidetector CT imaging of the abdomen and pelvis was performed using the standard protocol following bolus administration of intravenous contrast.  CONTRAST:  OMNIPAQUE IOHEXOL 300 MG/ML  SOLN  COMPARISON:  None.  FINDINGS: Lower chest: A few scattered pulmonary nodules measure 4 mm or less in size and are most likely benign in a patient of this age. Heart size normal. No pericardial or pleural effusion.  Hepatobiliary: Liver and gallbladder are unremarkable. No  biliary ductal dilatation.  Pancreas: Negative.  Spleen: Negative.  Adrenals/Urinary Tract: Adrenal glands and kidneys are unremarkable. Likely early excretion of contrast from the left kidney. Difficult to definitively exclude a stone (series 2, image 33). Ureters are decompressed. Bladder is unremarkable.  Stomach/Bowel: Stomach and small bowel are unremarkable. Appendix is dilated and fluid filled, with a diameter of 11 mm. There is surrounding inflammatory haziness and fluid. Colon is unremarkable.  Vascular/Lymphatic: Vascular structures are unremarkable. No pathologically enlarged lymph nodes.  Reproductive: Prostate is normal in size.  Other: Small pelvic free fluid. Mesenteries and peritoneum are otherwise unremarkable.  Musculoskeletal: No worrisome lytic or sclerotic lesions.  IMPRESSION: 1. Acute appendicitis with mild periappendiceal inflammatory changes. No perforation or abscess. 2. Small pelvic free fluid. 3. Likely early excretion of contrast from the left kidney. Difficult to definitively exclude a nonobstructing stone.   Electronically Signed   By: Leanna Battles M.D.   On: 05/25/2015 16:43    Medications:    . dextrose 5 % and 0.9% NaCl 1,000 mL (05/25/15 2221)   Prior to Admission medications   Not on File     Assessment/Plan Acute appendicitis S/p laparoscopic appendectomy, 05/25/15, Dr. Derrell Lolling  ABX:  NONE DVT: SCD    Plan:  Advance diet and send home after he is up and walking.  Follow up in clinic 3 weeks  Ardelia Wrede 05/26/2015

## 2015-05-26 NOTE — Discharge Instructions (Signed)

## 2015-05-26 NOTE — Discharge Summary (Signed)
Physician Discharge Summary  Patient ID: Gary Lambert MRN: [id number] DOB/AGE: [birth year] 29 y.o.  Admit date: 05/25/2015 Discharge date: 05/26/2015  Admission Diagnoses:  Acute appendicitis  Discharge Diagnoses:  Same Active Problems:   Acute appendicitis   PROCEDURES: Laparoscopic Appendectomy, 05/24/15, Dr. Jerene Dilling Course: 29 y/o M with Gen abd pain that started approx 24h ago. Pt states that it localized to the RLQ. Pt states that the pain continued throughout the day. He presented to the ED for further care.  CT showed acute appendicitis, he was seen by Dr. Derrell Lolling and taken to the OR that evening.  Post op he is doing well, his sites look fine he has ambulated, and we plan to send him home after breakfast and after he tries PO pain meds. He will follow up in the clinic in 3 weeks.  CBC Latest Ref Rng 05/25/2015  WBC 4.0 - 10.5 K/uL 7.8  Hemoglobin 13.0 - 17.0 g/dL 78.2  Hematocrit 95.6 - 52.0 % 45.7  Platelets 150 - 400 K/uL 248   CMP Latest Ref Rng 05/25/2015  Glucose 65 - 99 mg/dL 96  BUN 6 - 20 mg/dL 10  Creatinine 2.13 - 0.86 mg/dL 5.78  Sodium 469 - 629 mmol/L 138  Potassium 3.5 - 5.1 mmol/L 4.0  Chloride 101 - 111 mmol/L 103  CO2 22 - 32 mmol/L 29  Calcium 8.9 - 10.3 mg/dL 9.6  Total Protein 6.5 - 8.1 g/dL 7.8  Total Bilirubin 0.3 - 1.2 mg/dL 0.8  Alkaline Phos 38 - 126 U/L 87  AST 15 - 41 U/L 41  ALT 17 - 63 U/L 57     Condition on d/c:  Improved    Disposition: Home     Medication List    TAKE these medications        acetaminophen 325 MG tablet  Commonly known as:  TYLENOL  Take 2 tablets (650 mg total) by mouth every 6 (six) hours as needed.     ibuprofen 200 MG tablet  Commonly known as:  MOTRIN IB  You can take 2-3 tablets every 6 hours as needed for pain.  You can buy this at any drug store.     oxyCODONE 5 MG immediate release tablet  Commonly known as:  Oxy IR/ROXICODONE  Take 1-2 tablets (5-10 mg total) by mouth  every 4 (four) hours as needed for moderate pain.       Follow-up Information    Follow up with [facility] On 06/16/2015.   Specialty:  General Surgery   Why:  arrive by 3PM for a 3:30PM post operative check up   Contact information:   [address] [place] [address] [phone]       Signed: Sherrie George 05/26/2015, 8:44 AM

## 2015-06-04 NOTE — Anesthesia Postprocedure Evaluation (Signed)
Anesthesia Post Note  Patient: Gary Lambert  Procedure(s) Performed: Procedure(s) (LRB): APPENDECTOMY LAPAROSCOPIC (N/A)  Anesthesia type: General  Patient location: PACU  Post pain: Pain level controlled and Adequate analgesia  Post assessment: Post-op Vital signs reviewed, Patient's Cardiovascular Status Stable, Respiratory Function Stable, Patent Airway and Pain level controlled  Last Vitals:  Filed Vitals:   05/26/15 0530  BP: 117/67  Pulse: 75  Temp: 36.6 C  Resp: 18    Post vital signs: Reviewed and stable  Level of consciousness: awake, alert  and oriented  Complications: No apparent anesthesia complications

## 2015-06-16 ENCOUNTER — Encounter: Payer: Self-pay | Admitting: General Surgery

## 2016-06-13 DIAGNOSIS — L0591 Pilonidal cyst without abscess: Secondary | ICD-10-CM | POA: Diagnosis not present

## 2016-06-24 DIAGNOSIS — L0591 Pilonidal cyst without abscess: Secondary | ICD-10-CM | POA: Diagnosis not present

## 2016-09-12 IMAGING — CT CT ABD-PELV W/ CM
2 of 4 series · 15 of 46 positions shown, 17 images · IV contrast (OMNIPAQUE 300)
Comparison: None.

CLINICAL DATA: Right lower quadrant pain since last night.

EXAM:
CT ABDOMEN AND PELVIS WITH CONTRAST
TECHNIQUE: Multidetector CT imaging of the abdomen and pelvis was performed
using the standard protocol following bolus administration of
intravenous contrast.
CONTRAST:  100mL OMNIPAQUE IOHEXOL 300 MG/ML  SOLN

[Series 2: abd/pel with · axial · 0.70mm/px · z∈[-700,-280]mm · 12 of 93 slices shown, 14 images]
[im 5/93  soft-tissue]
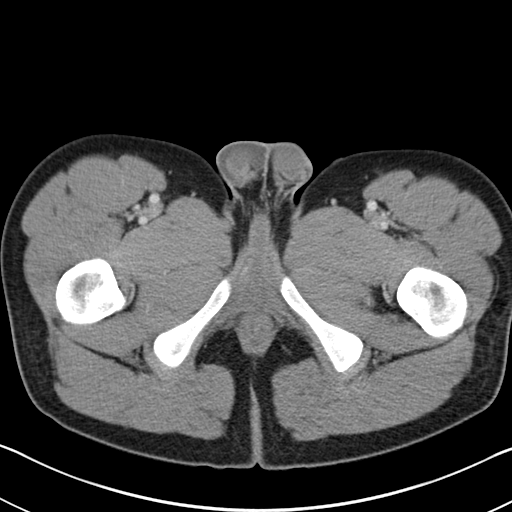
[im 5/93  bone]
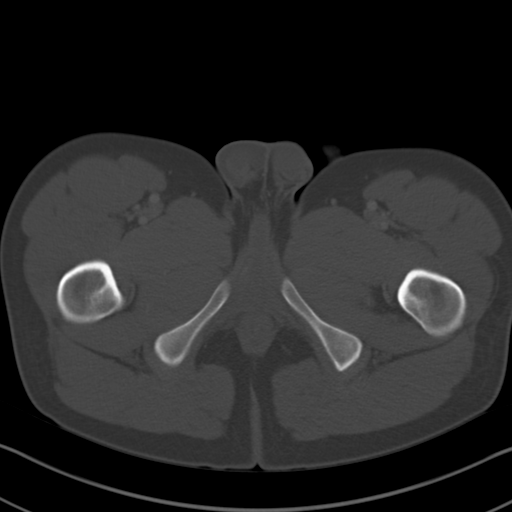
[im 13/93  soft-tissue]
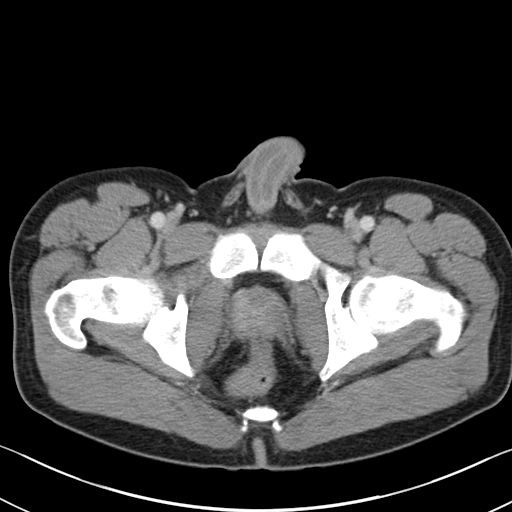
[im 21/93  soft-tissue]
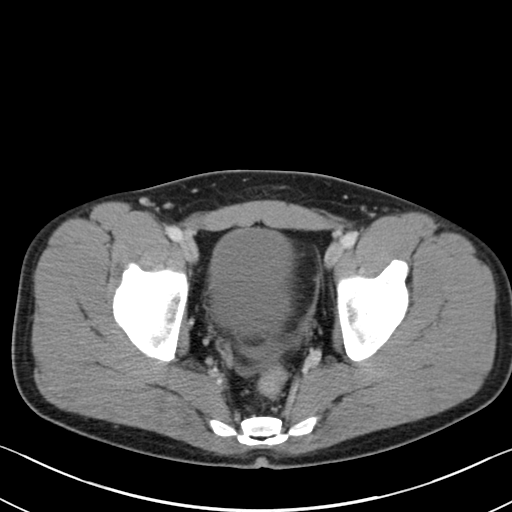
[im 29/93  soft-tissue]
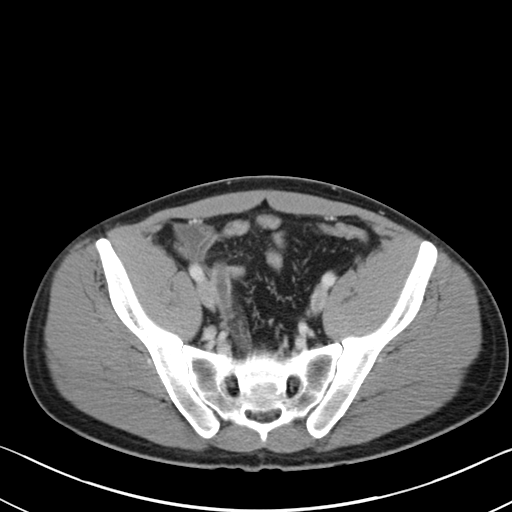
[im 37/93  soft-tissue]
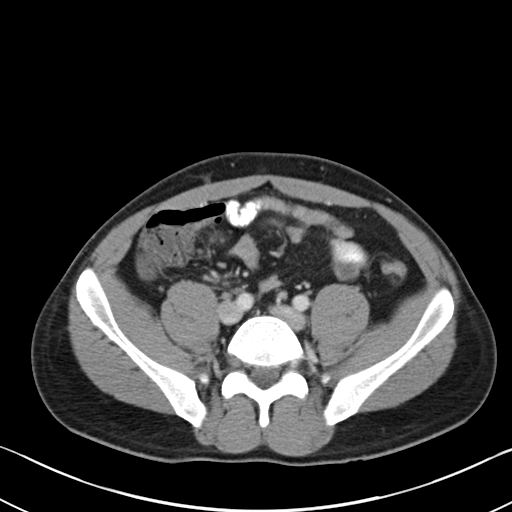
[im 45/93  soft-tissue]
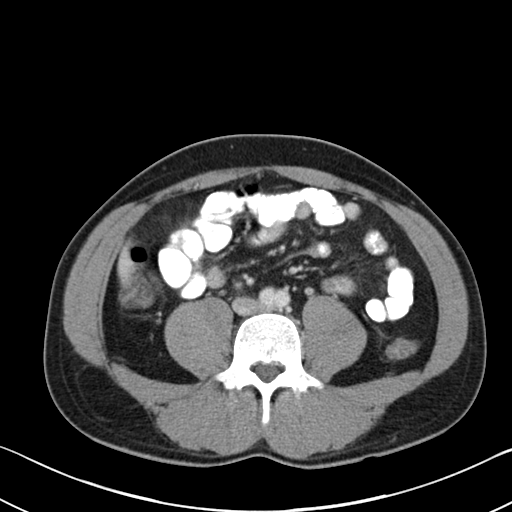
[im 49/93  soft-tissue]
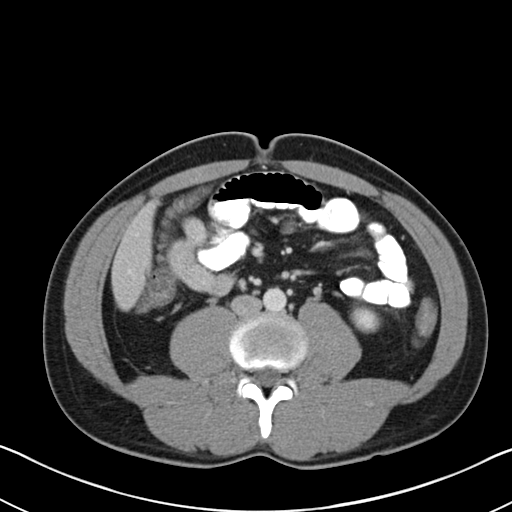
[im 57/93  soft-tissue]
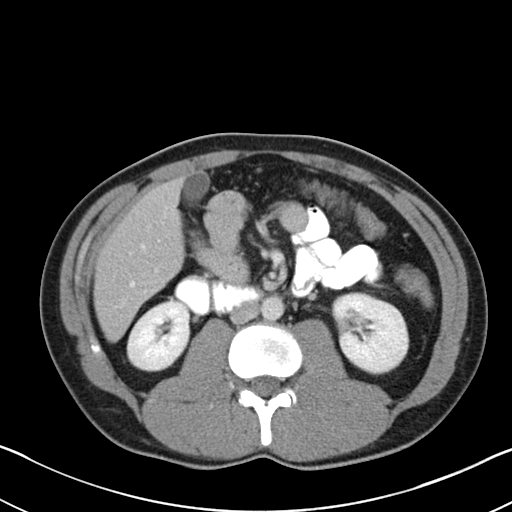
[im 65/93  soft-tissue]
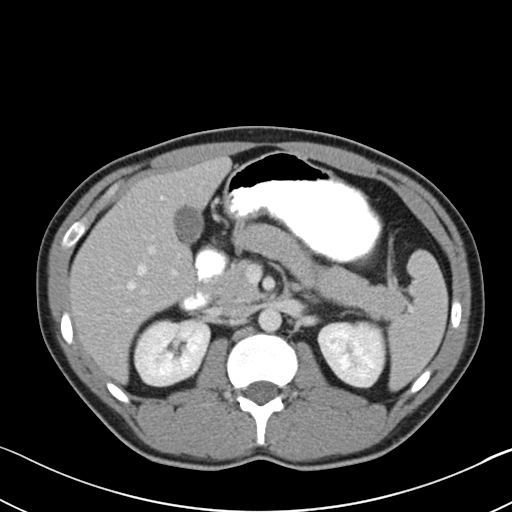
[im 65/93  bone]
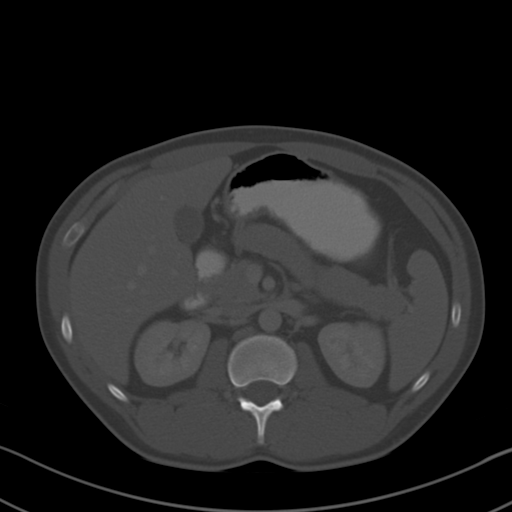
[im 73/93  soft-tissue]
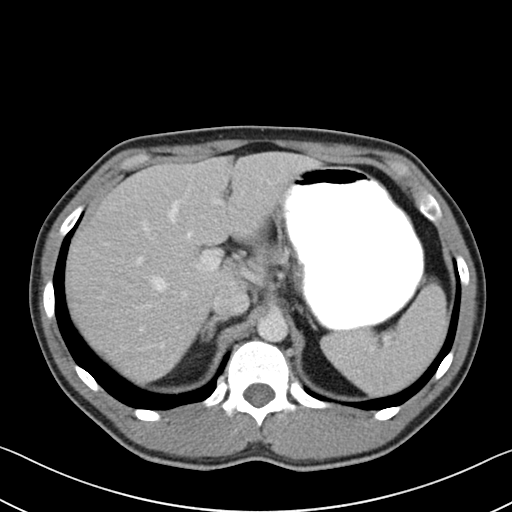
[im 81/93  soft-tissue]
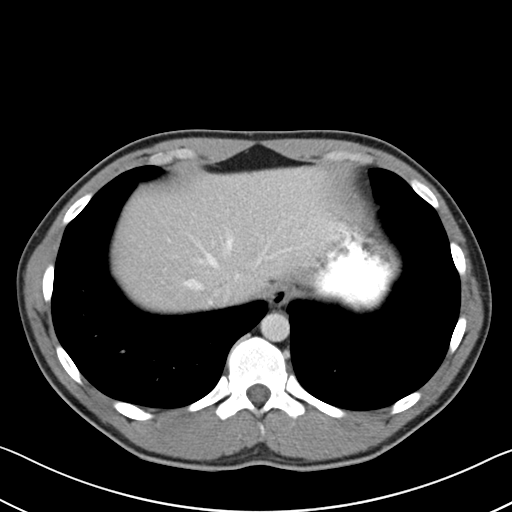
[im 89/93  soft-tissue]
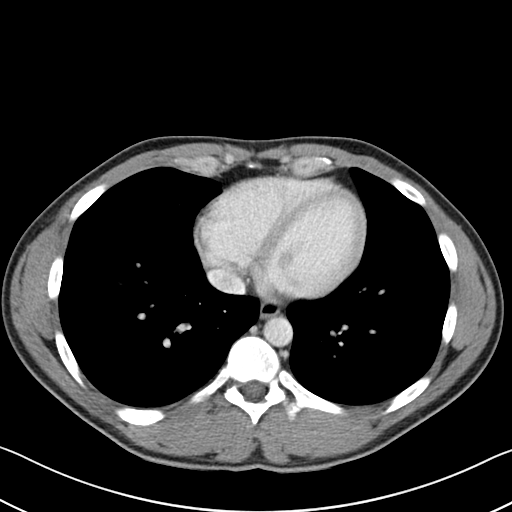

[Series 3: coronal a/|p · coronal · 0.63mm/px · 3 of 84 slices shown]
[im 28/84  soft-tissue]
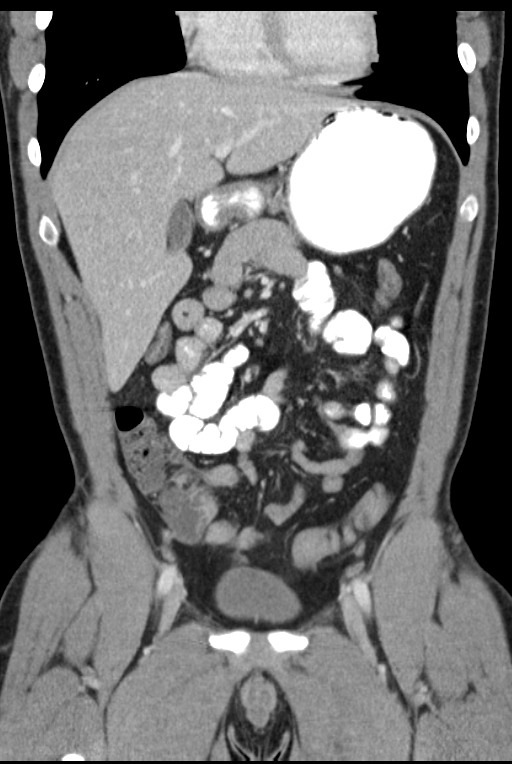
[im 37/84  soft-tissue]
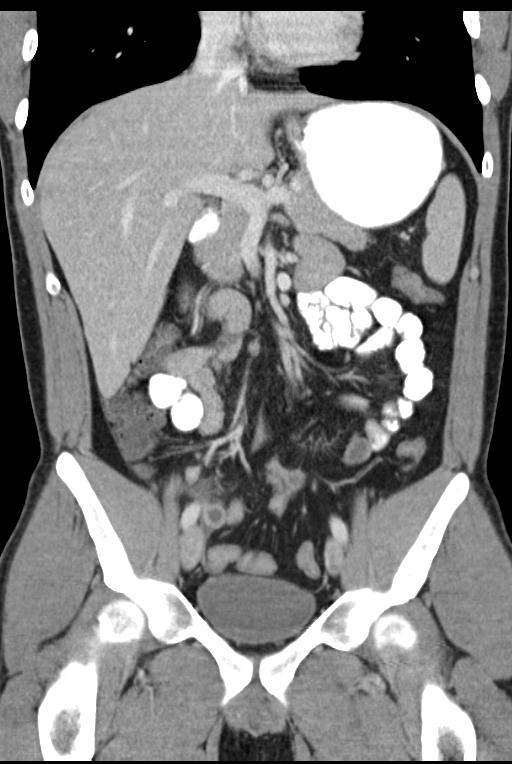
[im 47/84  soft-tissue]
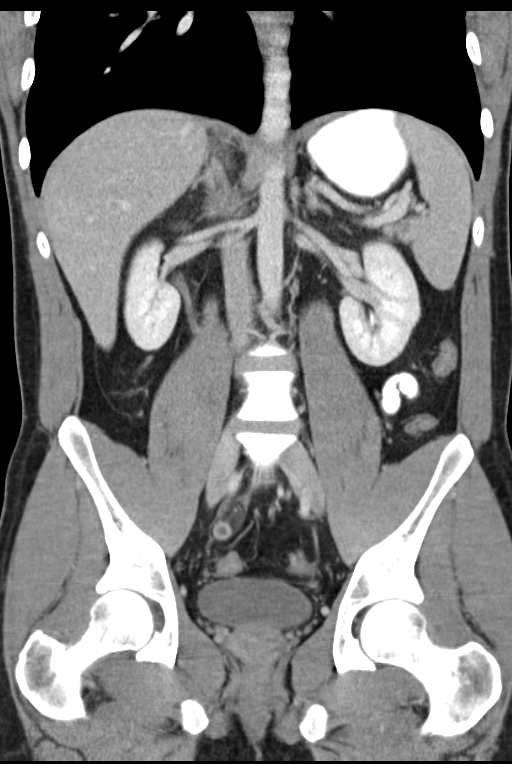

[15 of 46 positions shown; findings below may reference images not displayed]

FINDINGS: Lower chest: A few scattered pulmonary nodules measure 4 mm or less
in size and are most likely benign in a patient of this age. Heart
size normal. No pericardial or pleural effusion.

Hepatobiliary: Liver and gallbladder are unremarkable. No biliary
ductal dilatation.

Pancreas: Negative.

Spleen: Negative.

Adrenals/Urinary Tract: Adrenal glands and kidneys are unremarkable.
Likely early excretion of contrast from the left kidney. Difficult
to definitively exclude a stone (series 2, image 33). Ureters are
decompressed. Bladder is unremarkable.

Stomach/Bowel: Stomach and small bowel are unremarkable. Appendix is
dilated and fluid filled, with a diameter of 11 mm. There is
surrounding inflammatory haziness and fluid. Colon is unremarkable.

Vascular/Lymphatic: Vascular structures are unremarkable. No
pathologically enlarged lymph nodes.

Reproductive: Prostate is normal in size.

Other: Small pelvic free fluid. Mesenteries and peritoneum are
otherwise unremarkable.

Musculoskeletal: No worrisome lytic or sclerotic lesions.
IMPRESSION: 1. Acute appendicitis with mild periappendiceal inflammatory
changes. No perforation or abscess.
2. Small pelvic free fluid.
3. Likely early excretion of contrast from the left kidney.
Difficult to definitively exclude a nonobstructing stone.

## 2016-10-07 DIAGNOSIS — H6992 Unspecified Eustachian tube disorder, left ear: Secondary | ICD-10-CM | POA: Diagnosis not present

## 2016-11-18 DIAGNOSIS — Z0000 Encounter for general adult medical examination without abnormal findings: Secondary | ICD-10-CM | POA: Diagnosis not present

## 2016-11-18 DIAGNOSIS — E785 Hyperlipidemia, unspecified: Secondary | ICD-10-CM | POA: Diagnosis not present

## 2017-05-22 DIAGNOSIS — G44309 Post-traumatic headache, unspecified, not intractable: Secondary | ICD-10-CM | POA: Diagnosis not present

## 2017-05-22 DIAGNOSIS — Z23 Encounter for immunization: Secondary | ICD-10-CM | POA: Diagnosis not present

## 2017-05-22 DIAGNOSIS — S0003XA Contusion of scalp, initial encounter: Secondary | ICD-10-CM | POA: Diagnosis not present

## 2017-05-22 DIAGNOSIS — S0101XA Laceration without foreign body of scalp, initial encounter: Secondary | ICD-10-CM | POA: Diagnosis not present

## 2017-06-01 DIAGNOSIS — L237 Allergic contact dermatitis due to plants, except food: Secondary | ICD-10-CM | POA: Diagnosis not present

## 2017-11-24 DIAGNOSIS — E785 Hyperlipidemia, unspecified: Secondary | ICD-10-CM | POA: Diagnosis not present

## 2017-11-24 DIAGNOSIS — Z0000 Encounter for general adult medical examination without abnormal findings: Secondary | ICD-10-CM | POA: Diagnosis not present

## 2017-11-24 DIAGNOSIS — Z23 Encounter for immunization: Secondary | ICD-10-CM | POA: Diagnosis not present

## 2018-01-30 DIAGNOSIS — K629 Disease of anus and rectum, unspecified: Secondary | ICD-10-CM | POA: Diagnosis not present

## 2018-02-08 DIAGNOSIS — L0591 Pilonidal cyst without abscess: Secondary | ICD-10-CM | POA: Diagnosis not present

## 2018-08-28 DIAGNOSIS — R399 Unspecified symptoms and signs involving the genitourinary system: Secondary | ICD-10-CM | POA: Diagnosis not present

## 2018-09-05 DIAGNOSIS — M79669 Pain in unspecified lower leg: Secondary | ICD-10-CM | POA: Diagnosis not present

## 2018-09-05 DIAGNOSIS — M79609 Pain in unspecified limb: Secondary | ICD-10-CM | POA: Diagnosis not present

## 2019-07-21 DIAGNOSIS — Z20828 Contact with and (suspected) exposure to other viral communicable diseases: Secondary | ICD-10-CM | POA: Diagnosis not present

## 2020-04-26 DIAGNOSIS — K529 Noninfective gastroenteritis and colitis, unspecified: Secondary | ICD-10-CM | POA: Diagnosis not present

## 2020-08-03 DIAGNOSIS — R946 Abnormal results of thyroid function studies: Secondary | ICD-10-CM | POA: Diagnosis not present

## 2020-08-03 DIAGNOSIS — Z0000 Encounter for general adult medical examination without abnormal findings: Secondary | ICD-10-CM | POA: Diagnosis not present

## 2020-08-10 DIAGNOSIS — R946 Abnormal results of thyroid function studies: Secondary | ICD-10-CM | POA: Diagnosis not present

## 2020-08-10 DIAGNOSIS — Z0000 Encounter for general adult medical examination without abnormal findings: Secondary | ICD-10-CM | POA: Diagnosis not present

## 2020-08-10 DIAGNOSIS — R7309 Other abnormal glucose: Secondary | ICD-10-CM | POA: Diagnosis not present

## 2020-08-10 DIAGNOSIS — E789 Disorder of lipoprotein metabolism, unspecified: Secondary | ICD-10-CM | POA: Diagnosis not present

## 2020-11-01 ENCOUNTER — Other Ambulatory Visit: Payer: Self-pay

## 2020-11-01 ENCOUNTER — Emergency Department (HOSPITAL_COMMUNITY): Payer: BC Managed Care – PPO

## 2020-11-01 ENCOUNTER — Emergency Department (HOSPITAL_COMMUNITY)
Admission: EM | Admit: 2020-11-01 | Discharge: 2020-11-01 | Disposition: A | Discharge status: Home / Self Care | Payer: BC Managed Care – PPO | Attending: Emergency Medicine | Admitting: Emergency Medicine

## 2020-11-01 ENCOUNTER — Encounter (HOSPITAL_COMMUNITY): Payer: Self-pay | Admitting: Emergency Medicine

## 2020-11-01 DIAGNOSIS — N200 Calculus of kidney: Secondary | ICD-10-CM

## 2020-11-01 DIAGNOSIS — N132 Hydronephrosis with renal and ureteral calculous obstruction: Secondary | ICD-10-CM | POA: Insufficient documentation

## 2020-11-01 DIAGNOSIS — N3289 Other specified disorders of bladder: Secondary | ICD-10-CM | POA: Diagnosis not present

## 2020-11-01 DIAGNOSIS — N049 Nephrotic syndrome with unspecified morphologic changes: Secondary | ICD-10-CM | POA: Diagnosis not present

## 2020-11-01 DIAGNOSIS — R109 Unspecified abdominal pain: Secondary | ICD-10-CM | POA: Diagnosis not present

## 2020-11-01 DIAGNOSIS — N1330 Unspecified hydronephrosis: Secondary | ICD-10-CM | POA: Diagnosis not present

## 2020-11-01 LAB — CBC WITH DIFFERENTIAL/PLATELET
Abs Immature Granulocytes: 0.01 10*3/uL (ref 0.00–0.07)
Basophils Absolute: 0 10*3/uL (ref 0.0–0.1)
Basophils Relative: 0 %
Eosinophils Absolute: 0 10*3/uL (ref 0.0–0.5)
Eosinophils Relative: 0 %
HCT: 43 % (ref 39.0–52.0)
Hemoglobin: 14.7 g/dL (ref 13.0–17.0)
Immature Granulocytes: 0 %
Lymphocytes Relative: 16 %
Lymphs Abs: 1 10*3/uL (ref 0.7–4.0)
MCH: 31.2 pg (ref 26.0–34.0)
MCHC: 34.2 g/dL (ref 30.0–36.0)
MCV: 91.3 fL (ref 80.0–100.0)
Monocytes Absolute: 0.3 10*3/uL (ref 0.1–1.0)
Monocytes Relative: 5 %
Neutro Abs: 5.2 10*3/uL (ref 1.7–7.7)
Neutrophils Relative %: 79 %
Platelets: 249 10*3/uL (ref 150–400)
RBC: 4.71 MIL/uL (ref 4.22–5.81)
RDW: 11.7 % (ref 11.5–15.5)
WBC: 6.5 10*3/uL (ref 4.0–10.5)
nRBC: 0 % (ref 0.0–0.2)

## 2020-11-01 LAB — BASIC METABOLIC PANEL
Anion gap: 11 (ref 5–15)
BUN: 16 mg/dL (ref 6–20)
CO2: 26 mmol/L (ref 22–32)
Calcium: 9.4 mg/dL (ref 8.9–10.3)
Chloride: 101 mmol/L (ref 98–111)
Creatinine, Ser: 1.04 mg/dL (ref 0.61–1.24)
GFR, Estimated: >60 mL/min (ref >60)
Glucose, Bld: 93 mg/dL (ref 70–99)
Potassium: 4.5 mmol/L (ref 3.5–5.1)
Sodium: 138 mmol/L (ref 135–145)

## 2020-11-01 LAB — URINALYSIS, ROUTINE W REFLEX MICROSCOPIC
Bilirubin Urine: NEGATIVE
Glucose, UA: NEGATIVE mg/dL
Hgb urine dipstick: NEGATIVE
Ketones, ur: NEGATIVE mg/dL
Leukocytes,Ua: NEGATIVE
Nitrite: NEGATIVE
Protein, ur: NEGATIVE mg/dL
Specific Gravity, Urine: 1.02 (ref 1.005–1.030)
pH: 8 (ref 5.0–8.0)

## 2020-11-01 MED ORDER — OXYCODONE-ACETAMINOPHEN 5-325 MG PO TABS: 1.0000 | ORAL_TABLET | Freq: Four times a day (QID) | ORAL | 0 refills | Status: AC | PRN

## 2020-11-01 MED ORDER — TAMSULOSIN HCL 0.4 MG PO CAPS
0.4000 mg | ORAL_CAPSULE | Freq: Every day | ORAL | Status: DC
  Administered 2020-11-01: 0.4 mg via ORAL
  Filled 2020-11-01: qty 1

## 2020-11-01 MED ORDER — KETOROLAC TROMETHAMINE 30 MG/ML IJ SOLN
30.0000 mg | Freq: Once | INTRAMUSCULAR | Status: AC
  Administered 2020-11-01: 30 mg via INTRAVENOUS
  Filled 2020-11-01: qty 1

## 2020-11-01 MED ORDER — TAMSULOSIN HCL 0.4 MG PO CAPS: 0.4000 mg | ORAL_CAPSULE | Freq: Every day | ORAL | 0 refills | Status: AC

## 2020-11-01 NOTE — ED Triage Notes (Signed)
Patient c/o right flank pain radiating to groin x3 hours.

## 2020-11-01 NOTE — ED Provider Notes (Signed)
[facility]-EMERGENCY DEPT Provider Note   CSN: [id number] Arrival date & time: 11/01/20  1429     History Chief Complaint  Patient presents with  . Flank Pain    Gary Lambert is a 35 y.o. male with a history of appendectomy.  Patient presents with a chief complaint of right flank pain; pain began this morning at 0900, pain is constant, progressively worsened, no alleviating or aggravating factors.  Patient states that this morning his pain was a dull ache.  Pain progressively worsened and then at 1200 pain suddenly intensified started radiating into his right groin; described this pain as sharp and rated it 10/10 on the pain scale.  Patient reports that he had nausea and vomiting associated with this pain.  Approximately 1-hour prior pain improved stopped radiating into his groin.  At present patient rates his pain 4/10 on the pain scale.  Patient denies any fever, chills, abdominal pain, dysuria, urinary frequency, penile discharge, penile pain, penile swelling, scrotal swelling, testicular pain, diarrhea, lightheadedness, syncopal episode.   Denies any history of kidney stones or testicular torsion.  HPI     History reviewed. No pertinent past medical history.  Patient Active Problem List   Diagnosis Date Noted  . Acute appendicitis 05/25/2015    Past Surgical History:  Procedure Laterality Date  . LAPAROSCOPIC APPENDECTOMY N/A 05/25/2015   Procedure: APPENDECTOMY LAPAROSCOPIC;  Surgeon: Axel Filler, MD;  Location: [facility] ORS;  Service: General;  Laterality: N/A;       No family history on file.  Social History   Tobacco Use  . Smoking status: Never Smoker  Substance Use Topics  . Alcohol use: Yes    Home Medications Prior to Admission medications   Medication Sig Start Date End Date Taking? Authorizing Provider  acetaminophen (TYLENOL) 325 MG tablet Take 2 tablets (650 mg total) by mouth every 6 (six) hours as needed. 05/26/15   Sherrie George, PA-C  ibuprofen (MOTRIN IB) 200 MG tablet You can take 2-3 tablets every 6 hours as needed for pain.  You can buy this at any drug store. 05/26/15   Sherrie George, PA-C  oxyCODONE (OXY IR/ROXICODONE) 5 MG immediate release tablet Take 1-2 tablets (5-10 mg total) by mouth every 4 (four) hours as needed for moderate pain. 05/26/15   Ashok Norris, NP    Allergies    Patient has no known allergies.  Review of Systems   Review of Systems  Constitutional: Negative for chills and fever.  Eyes: Negative for visual disturbance.  Respiratory: Negative for shortness of breath.   Cardiovascular: Negative for chest pain.  Gastrointestinal: Negative for abdominal pain, nausea and vomiting.  Genitourinary: Positive for flank pain (right). Negative for decreased urine volume, difficulty urinating, dysuria, frequency, genital sores, hematuria, penile discharge, penile pain, penile swelling, scrotal swelling, testicular pain and urgency.  Musculoskeletal: Negative for back pain and neck pain.  Skin: Negative for color change and rash.  Neurological: Negative for dizziness, syncope, light-headedness and headaches.  Psychiatric/Behavioral: Negative for confusion.    Physical Exam Updated Vital Signs BP (!) 130/97   Pulse 72   Temp 98.7 F (37.1 C) (Oral)   Resp 18   SpO2 98%   Physical Exam Vitals and nursing note reviewed. Exam conducted with a chaperone present Education officer, museum).  Constitutional:      General: He is not in acute distress.    Appearance: He is not ill-appearing, toxic-appearing or diaphoretic.  HENT:     Head: Normocephalic.  Eyes:     General: No scleral icterus.       Right eye: No discharge.        Left eye: No discharge.  Cardiovascular:     Rate and Rhythm: Normal rate.  Pulmonary:     Effort: Pulmonary effort is normal.  Abdominal:     General: Abdomen is flat. Bowel sounds are normal. There is no distension. There are no signs of injury.     Palpations:  Abdomen is soft. There is no mass or pulsatile mass.     Tenderness: There is no abdominal tenderness. There is right CVA tenderness. There is no left CVA tenderness, guarding or rebound.  Genitourinary:    Pubic Area: No rash.      Penis: Circumcised.      Testes: Cremasteric reflex is present.        Right: Mass, tenderness, swelling, testicular hydrocele or varicocele not present. Cremasteric reflex is present.         Left: Mass, tenderness, swelling, testicular hydrocele or varicocele not present. Cremasteric reflex is present.      Epididymis:     Right: Not inflamed or enlarged. No mass or tenderness.     Left: Not inflamed or enlarged. No mass or tenderness.     Tanner stage (genital): 5.  Musculoskeletal:     Cervical back: Neck supple.  Skin:    General: Skin is warm and dry.     Coloration: Skin is not jaundiced or pale.  Neurological:     General: No focal deficit present.     Mental Status: He is alert.  Psychiatric:        Behavior: Behavior is cooperative.     ED Results / Procedures / Treatments   Labs (all labs ordered are listed, but only abnormal results are displayed) Labs Reviewed  URINALYSIS, ROUTINE W REFLEX MICROSCOPIC - Abnormal; Notable for the following components:      Result Value   APPearance CLOUDY (*)    All other components within normal limits  BASIC METABOLIC PANEL  CBC WITH DIFFERENTIAL/PLATELET    EKG None  Radiology CT Renal Stone Study  Result Date: 11/01/2020 CLINICAL DATA:  Right flank pain radiating to the groin. EXAM: CT ABDOMEN AND PELVIS WITHOUT CONTRAST TECHNIQUE: Multidetector CT imaging of the abdomen and pelvis was performed following the standard protocol without IV contrast. COMPARISON:  CT 05/25/2015 FINDINGS: Lower chest: Lung bases are clear. Hepatobiliary: No focal liver abnormality is seen. No gallstones, gallbladder wall thickening, or biliary dilatation. Pancreas: Unremarkable. No pancreatic ductal dilatation or  surrounding inflammatory changes. Spleen: Normal in size without focal abnormality. Adrenals/Urinary Tract: Normal adrenal glands. Punctate, approximately 2 mm stone in the distal right ureter, just proximal to the ureterovesicular junction with mild right hydroureteronephrosis. Mild right perinephric edema. No additional nonobstructing right renal calculi. No left renal stones or hydronephrosis. Left ureter is decompressed. Urinary bladder is partially distended. Stomach/Bowel: Decompressed stomach. Normal positioning of the duodenum and ligament of Treitz. No small bowel obstruction or inflammation. Appendectomy per history. Small volume of colonic stool. No colonic wall thickening or inflammation. Vascular/Lymphatic: Normal caliber abdominal aorta. No bulky abdominopelvic adenopathy. Reproductive: Prostate is unremarkable. Other: No free air, free fluid, or intra-abdominal fluid collection. Musculoskeletal: There are no acute or suspicious osseous abnormalities. Occasional scattered bone islands in the pelvis. IMPRESSION: Punctate, approximately 2 mm, stone in the distal right ureter with mild hydronephrosis. Electronically Signed   By: Narda Rutherford M.D.   On: 11/01/2020  20:20    Procedures Procedures   Medications Ordered in ED Medications  ketorolac (TORADOL) 30 MG/ML injection 30 mg (has no administration in time range)  tamsulosin (FLOMAX) capsule 0.4 mg (has no administration in time range)    ED Course  I have reviewed the triage vital signs and the nursing notes.  Pertinent labs & imaging results that were available during my care of the patient were reviewed by me and considered in my medical decision making (see chart for details).    MDM Rules/Calculators/A&P                          Alert 35 year old male, no acute distress, nontoxic.  Patient is noted to be standing on entering the room.  Patient does not appear uncomfortable.  Patient reports chief complaint of right flank  pain with radiation to right groin.  Abdomen soft, nondistended, nontender, no mass or hernia.  Cremasteric reflex intact bilaterally, no swelling or tenderness to scrotum normal, testicles, or epididymitis.  Less concerning for testicular torsion.    Right CVA tenderness present.  Return for kidney stone.  Noncontrast CT scan ordered.  CBC, BMP, UA ordered and pending.  Examination patient complains of slightly worsening right flank pain, abdomen soft, nondistended, nontender.  Patient was offered Tylenol, ibuprofen, or opiate pain medicine and refused at this time.  UA shows no sign of urinary tract infection or hematuria.  CBC unremarkable.  BMP unremarkable.  CT renal stone study showed 2 mm stone in the distal right ureter with mild hydronephrosis.  Patient's pain is adequately controlled.  Patient able to tolerate p.o. intake.  He was given Toradol and Flomax.  Patient will be prescribed 7-day course of Flomax.  Patient will be prescribed 2-day course of Percocet.  Will follow-up with urology.    Discussed results, findings, treatment and follow up. Patient advised of return precautions. Patient verbalized understanding and agreed with plan.   Final Clinical Impression(s) / ED Diagnoses Final diagnoses:  Right renal stone    Rx / DC Orders ED Discharge Orders         Ordered    tamsulosin (FLOMAX) 0.4 MG CAPS capsule  Daily        11/01/20 2104    oxyCODONE-acetaminophen (PERCOCET/ROXICET) 5-325 MG tablet  Every 6 hours PRN        11/01/20 2104           Berneice Heinrich 11/01/20 2109    Jacalyn Lefevre, MD 11/01/20 2200

## 2020-11-01 NOTE — Discharge Instructions (Addendum)
You came to the emergency department to be evaluated for your right flank pain.  Your CT scan showed a 2 mm kidney stone.  You were given Toradol and Flomax in the emergency department.  You are prescribed a 7-day course of Flomax; please take 1 pill once a day.   Pain management: You were also prescribed Percocet, please take 1 pill every 6 hours as needed for severe pain.  Do not drive, use machinery, or do anything that needs alertness or clear vision until you can do it safely.  Do not combine this medication with alcoholic beverages, marijuana, or other central nervous system depressants.    Please take Ibuprofen (Advil, motrin) to relieve your pain.  You may take up to 600 MG (3 pills) of normal strength ibuprofen every 8 hours as needed.   Do not drink alcohol while taking this medications.  Do not take other NSAID'S while taking ibuprofen (such as aleve or naproxen).  Please take ibuprofen with food to decrease stomach upset.   Please follow-up with urology. Please call the number provided tomorrow to set up an appointment.  Get help right away if: You have a fever or chills. Worsening pain uncontrolled with pain medication You develop new abdominal pain. You faint. You are unable to urinate. Develop persistent nausea or vomiting Unable to tolerate drinking fluids or eating food.

## 2020-11-18 DIAGNOSIS — L739 Follicular disorder, unspecified: Secondary | ICD-10-CM | POA: Diagnosis not present

## 2020-11-22 ENCOUNTER — Ambulatory Visit (HOSPITAL_COMMUNITY): Payer: Self-pay

## 2020-11-23 DIAGNOSIS — L0291 Cutaneous abscess, unspecified: Secondary | ICD-10-CM | POA: Diagnosis not present

## 2020-11-27 DIAGNOSIS — J342 Deviated nasal septum: Secondary | ICD-10-CM | POA: Diagnosis not present

## 2020-11-27 DIAGNOSIS — L739 Follicular disorder, unspecified: Secondary | ICD-10-CM | POA: Diagnosis not present

## 2021-01-28 DIAGNOSIS — U071 COVID-19: Secondary | ICD-10-CM | POA: Diagnosis not present

## 2021-07-27 DIAGNOSIS — Z3009 Encounter for other general counseling and advice on contraception: Secondary | ICD-10-CM | POA: Diagnosis not present

## 2021-09-03 DIAGNOSIS — Z302 Encounter for sterilization: Secondary | ICD-10-CM | POA: Diagnosis not present

## 2022-07-17 DIAGNOSIS — J40 Bronchitis, not specified as acute or chronic: Secondary | ICD-10-CM | POA: Diagnosis not present

## 2022-07-17 DIAGNOSIS — R0981 Nasal congestion: Secondary | ICD-10-CM | POA: Diagnosis not present

## 2022-07-17 DIAGNOSIS — Z20828 Contact with and (suspected) exposure to other viral communicable diseases: Secondary | ICD-10-CM | POA: Diagnosis not present

## 2022-10-07 DIAGNOSIS — L821 Other seborrheic keratosis: Secondary | ICD-10-CM | POA: Diagnosis not present

## 2022-10-07 DIAGNOSIS — L578 Other skin changes due to chronic exposure to nonionizing radiation: Secondary | ICD-10-CM | POA: Diagnosis not present

## 2022-10-07 DIAGNOSIS — L814 Other melanin hyperpigmentation: Secondary | ICD-10-CM | POA: Diagnosis not present

## 2022-10-07 DIAGNOSIS — D1801 Hemangioma of skin and subcutaneous tissue: Secondary | ICD-10-CM | POA: Diagnosis not present

## 2022-11-22 DIAGNOSIS — R946 Abnormal results of thyroid function studies: Secondary | ICD-10-CM | POA: Diagnosis not present

## 2022-11-22 DIAGNOSIS — Z79899 Other long term (current) drug therapy: Secondary | ICD-10-CM | POA: Diagnosis not present

## 2022-11-22 DIAGNOSIS — E789 Disorder of lipoprotein metabolism, unspecified: Secondary | ICD-10-CM | POA: Diagnosis not present

## 2022-11-22 DIAGNOSIS — R7309 Other abnormal glucose: Secondary | ICD-10-CM | POA: Diagnosis not present

## 2022-11-22 DIAGNOSIS — Z0000 Encounter for general adult medical examination without abnormal findings: Secondary | ICD-10-CM | POA: Diagnosis not present

## 2022-11-28 DIAGNOSIS — R079 Chest pain, unspecified: Secondary | ICD-10-CM | POA: Diagnosis not present

## 2022-11-28 DIAGNOSIS — Z566 Other physical and mental strain related to work: Secondary | ICD-10-CM | POA: Diagnosis not present

## 2022-11-28 DIAGNOSIS — R7401 Elevation of levels of liver transaminase levels: Secondary | ICD-10-CM | POA: Diagnosis not present

## 2022-11-28 DIAGNOSIS — E789 Disorder of lipoprotein metabolism, unspecified: Secondary | ICD-10-CM | POA: Diagnosis not present

## 2022-11-28 DIAGNOSIS — Z0000 Encounter for general adult medical examination without abnormal findings: Secondary | ICD-10-CM | POA: Diagnosis not present

## 2023-07-05 DIAGNOSIS — F82 Specific developmental disorder of motor function: Secondary | ICD-10-CM | POA: Diagnosis not present

## 2023-07-05 DIAGNOSIS — R278 Other lack of coordination: Secondary | ICD-10-CM | POA: Diagnosis not present

## 2023-07-05 DIAGNOSIS — M6281 Muscle weakness (generalized): Secondary | ICD-10-CM | POA: Diagnosis not present

## 2023-07-05 DIAGNOSIS — R2689 Other abnormalities of gait and mobility: Secondary | ICD-10-CM | POA: Diagnosis not present

## 2023-07-05 DIAGNOSIS — G808 Other cerebral palsy: Secondary | ICD-10-CM | POA: Diagnosis not present

## 2023-08-04 DIAGNOSIS — Q203 Discordant ventriculoarterial connection: Secondary | ICD-10-CM | POA: Diagnosis not present

## 2023-12-06 DIAGNOSIS — E789 Disorder of lipoprotein metabolism, unspecified: Secondary | ICD-10-CM | POA: Diagnosis not present

## 2023-12-06 DIAGNOSIS — G808 Other cerebral palsy: Secondary | ICD-10-CM | POA: Diagnosis not present

## 2023-12-06 DIAGNOSIS — R222 Localized swelling, mass and lump, trunk: Secondary | ICD-10-CM | POA: Diagnosis not present

## 2023-12-06 DIAGNOSIS — R7401 Elevation of levels of liver transaminase levels: Secondary | ICD-10-CM | POA: Diagnosis not present

## 2023-12-06 DIAGNOSIS — F801 Expressive language disorder: Secondary | ICD-10-CM | POA: Diagnosis not present

## 2023-12-06 DIAGNOSIS — Z79899 Other long term (current) drug therapy: Secondary | ICD-10-CM | POA: Diagnosis not present

## 2023-12-06 DIAGNOSIS — R569 Unspecified convulsions: Secondary | ICD-10-CM | POA: Diagnosis not present

## 2023-12-06 DIAGNOSIS — R488 Other symbolic dysfunctions: Secondary | ICD-10-CM | POA: Diagnosis not present

## 2023-12-06 DIAGNOSIS — M6281 Muscle weakness (generalized): Secondary | ICD-10-CM | POA: Diagnosis not present

## 2023-12-06 DIAGNOSIS — R278 Other lack of coordination: Secondary | ICD-10-CM | POA: Diagnosis not present

## 2023-12-06 DIAGNOSIS — R7309 Other abnormal glucose: Secondary | ICD-10-CM | POA: Diagnosis not present

## 2023-12-06 DIAGNOSIS — R2689 Other abnormalities of gait and mobility: Secondary | ICD-10-CM | POA: Diagnosis not present

## 2023-12-06 DIAGNOSIS — R946 Abnormal results of thyroid function studies: Secondary | ICD-10-CM | POA: Diagnosis not present

## 2023-12-25 DIAGNOSIS — Q203 Discordant ventriculoarterial connection: Secondary | ICD-10-CM | POA: Diagnosis not present

## 2023-12-25 DIAGNOSIS — Z23 Encounter for immunization: Secondary | ICD-10-CM | POA: Diagnosis not present

## 2024-03-08 DIAGNOSIS — R222 Localized swelling, mass and lump, trunk: Secondary | ICD-10-CM | POA: Diagnosis not present

## 2024-06-22 DIAGNOSIS — H6501 Acute serous otitis media, right ear: Secondary | ICD-10-CM | POA: Diagnosis not present
